# Patient Record
Sex: Female | Born: 1995 | Race: White | Hispanic: No | Marital: Married | Smoking: Former smoker
Health system: Southern US, Community
[De-identification: ages and names within clinical notes are randomized; demographics above are authoritative.]

## PROBLEM LIST (undated history)

## (undated) ENCOUNTER — Emergency Department (HOSPITAL_BASED_OUTPATIENT_CLINIC_OR_DEPARTMENT_OTHER): Admission: EM | Payer: Self-pay | Source: Home / Self Care

## (undated) DIAGNOSIS — F329 Major depressive disorder, single episode, unspecified: Secondary | ICD-10-CM

## (undated) DIAGNOSIS — J45909 Unspecified asthma, uncomplicated: Secondary | ICD-10-CM

## (undated) DIAGNOSIS — F419 Anxiety disorder, unspecified: Secondary | ICD-10-CM

## (undated) DIAGNOSIS — F32A Depression, unspecified: Secondary | ICD-10-CM

## (undated) HISTORY — DX: Unspecified asthma, uncomplicated: J45.909

## (undated) HISTORY — DX: Depression, unspecified: F32.A

## (undated) HISTORY — DX: Major depressive disorder, single episode, unspecified: F32.9

---
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
## 2014-09-26 HISTORY — PX: KNEE ARTHROSCOPY: SHX127

## 2016-12-07 ENCOUNTER — Emergency Department (HOSPITAL_COMMUNITY)
Admission: EM | Admit: 2016-12-07 | Discharge: 2016-12-07 | Disposition: A | Discharge status: Home / Self Care | Payer: Federal, State, Local not specified - PPO | Attending: Emergency Medicine | Admitting: Emergency Medicine

## 2016-12-07 ENCOUNTER — Encounter (HOSPITAL_COMMUNITY): Payer: Self-pay | Admitting: Emergency Medicine

## 2016-12-07 DIAGNOSIS — T7840XA Allergy, unspecified, initial encounter: Secondary | ICD-10-CM | POA: Diagnosis present

## 2016-12-07 HISTORY — DX: Anxiety disorder, unspecified: F41.9

## 2016-12-07 MED ORDER — DIPHENHYDRAMINE HCL 25 MG PO CAPS
50.0000 mg | ORAL_CAPSULE | Freq: Once | ORAL | Status: AC
  Administered 2016-12-07: 50 mg via ORAL
  Filled 2016-12-07: qty 2

## 2016-12-07 MED ORDER — DIPHENHYDRAMINE HCL 25 MG PO CAPS: 25.0000 mg | ORAL_CAPSULE | Freq: Four times a day (QID) | ORAL | 0 refills | Status: DC | PRN

## 2016-12-07 NOTE — ED Provider Notes (Signed)
[facility]-EMERGENCY DEPT Provider Note    By signing my name below, I, Earmon PhoenixJennifer Waddell, attest that this documentation has been prepared under the direction and in the presence of Melburn HakeNicole Izaya Netherton, PA-C. Electronically Signed: Earmon PhoenixJennifer Waddell, ED Scribe. 12/07/16. 8:51 PM.    History   Chief Complaint Chief Complaint  Patient presents with  . Allergic Reaction   The history is provided by the patient and medical records. No language interpreter was used.    [address]Amanda Espinoza is an obese 21 y.o. female who presents to the Emergency Department complaining of improving hives that began around 5:30pm, about 10-15 minutes after eating dinner. She reports associated improving tongue swelling and throat itching after taking an antihistamine at home. Pt reports eating chicken and frozen mixed vegetables she made at home. She has taken a generic antihistamine with some relief. There are no modifying factors noted. She denies difficulty breathing or swallowing, CP, abdominal pain, nausea, vomiting, fever, chills. She denies any new medications. 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   Past Medical History:  Diagnosis Date  . Anxiety     There are no active problems to display for this patient.   Past Surgical History:  Procedure Laterality Date  . APPENDECTOMY      OB History    No data available       Home Medications    Prior to Admission medications   Medication Sig Start Date End Date Taking? Authorizing Provider  diphenhydrAMINE (BENADRYL) 25 mg capsule Take 1 capsule (25 mg total) by mouth every 6 (six) hours as needed. 12/07/16   Barrett HenleNicole Elizabeth Biance Moncrief, PA-C    Family History No family history on file.  Social History Social History  Substance Use Topics  . Smoking status: Never Smoker  . Smokeless tobacco: Never Used  . Alcohol use Yes     Allergies   Patient has no allergy information on record.   Review of Systems Review of Systems  Constitutional: Negative for  chills and fever.  HENT: Negative for trouble swallowing.        Itchy tongue and throat  Respiratory: Negative for shortness of breath.   Cardiovascular: Negative for chest pain.  Gastrointestinal: Negative for nausea and vomiting.  Skin: Positive for rash.     Physical Exam Updated Vital Signs BP 114/89 (BP Location: Right Arm)   Pulse 84   Temp 98.7 F (37.1 C) (Oral)   Resp 22   LMP 11/23/2016   SpO2 99%   Physical Exam  Constitutional: She is oriented to person, place, and time. She appears well-developed and well-nourished.  HENT:  Head: Normocephalic and atraumatic.  Mouth/Throat: Uvula is midline, oropharynx is clear and moist and mucous membranes are normal. No oropharyngeal exudate, posterior oropharyngeal edema, posterior oropharyngeal erythema or tonsillar abscesses. No tonsillar exudate.  No facial or neck swelling. No tongue swelling. Floor of mouth soft. Pt tolerating secretions.  Eyes: Conjunctivae and EOM are normal. Right eye exhibits no discharge. Left eye exhibits no discharge. No scleral icterus.  Neck: Normal range of motion. Neck supple.  Cardiovascular: Normal rate, regular rhythm, normal heart sounds and intact distal pulses.  Exam reveals no gallop and no friction rub.   No murmur heard. Pulmonary/Chest: Effort normal and breath sounds normal. No respiratory distress. She has no wheezes. She has no rales. She exhibits no tenderness.  Abdominal: Soft. Bowel sounds are normal. She exhibits no distension and no mass. There is no tenderness. There is no rebound and no guarding.  Musculoskeletal: Normal range of motion. She exhibits no edema.  Lymphadenopathy:    She has no cervical adenopathy.  Neurological: She is alert and oriented to person, place, and time.  Skin: Skin is warm and dry.  Few small scattered erythematous macules present to anterior neck.  Nursing note and vitals reviewed.    ED Treatments / Results  DIAGNOSTIC STUDIES: Oxygen  Saturation is 99% on RA, normal by my interpretation.   COORDINATION OF CARE: 8:49 PM- Will order dose of Benadryl prior to discharge. Pt verbalizes understanding and agrees to plan.  Medications  diphenhydrAMINE (BENADRYL) capsule 50 mg (50 mg Oral Given 12/07/16 2120)    Labs (all labs ordered are listed, but only abnormal results are displayed) Labs Reviewed - No data to display  EKG  EKG Interpretation None       Radiology No results found.  Procedures Procedures (including critical care time)  Medications Ordered in ED Medications  diphenhydrAMINE (BENADRYL) capsule 50 mg (50 mg Oral Given 12/07/16 2120)     Initial Impression / Assessment and Plan / ED Course  I have reviewed the triage vital signs and the nursing notes.  Pertinent labs & imaging results that were available during my care of the patient were reviewed by me and considered in my medical decision making (see chart for details).     Patient presents with allergic reaction. Symptoms improved after taking an antihistamine at home prior to arrival. Patient re-evaluated prior to dc, is hemodynamically stable, in no respiratory distress, and denies the feeling of throat closing. Pt has been advised to take OTC benadryl & return to the ED if they have a mod-severe allergic rxn (s/s including throat closing, difficulty breathing, swelling of lips face or tongue). Pt is to follow up with their PCP. Pt is agreeable with plan & verbalizes understanding.   I personally performed the services described in this documentation, which was scribed in my presence. The recorded information has been reviewed and is accurate.    Final Clinical Impressions(s) / ED Diagnoses   Final diagnoses:  Allergic reaction, initial encounter    New Prescriptions New Prescriptions   DIPHENHYDRAMINE (BENADRYL) 25 MG CAPSULE    Take 1 capsule (25 mg total) by mouth every 6 (six) hours as needed.     Satira Sark [place],  [place] 12/07/16 2125    Canary Brim Tegeler, MD 12/08/16 2101

## 2016-12-07 NOTE — Discharge Instructions (Signed)
I recommend continuing to take Benadryl as prescribed as needed for hives/itching/swelling. I recommend refraining from eating any of the foods he had tonight which may have caused her allergic reaction. Follow-up with your primary care provider regarding your allergic reaction. Return to the emergency department if symptoms worsen or new onset of fever, facial/neck swelling, swelling of lips/tongue/throat, drooling due to being unable to swallow, difficulty breathing, sensation of throat closing, vomiting.

## 2016-12-07 NOTE — ED Notes (Signed)
Pt states that she made dinner and started to have hives as well as itchiness to her tongue, and throat. She states they were on her face, neck, and legs. She reports taking a generic antihistamine similar to claritin prior to arrival.

## 2016-12-07 NOTE — ED Notes (Signed)
This RN spoke with Mardella LaymanNicole PA-C on behalf of triage to get patient seen in pod C. PA is okay with seeing patient as long as no angioedema is present.

## 2016-12-07 NOTE — ED Triage Notes (Signed)
Pt c/o hives, itchy throat, and swollen tongue since eating chicken and mixed vegetables at 5:30pm.  Pt has known allergy to tree nuts and states it may have been in a new spice she used.  Speaking in complete sentences.  NAD at this time.

## 2017-04-26 ENCOUNTER — Emergency Department (HOSPITAL_BASED_OUTPATIENT_CLINIC_OR_DEPARTMENT_OTHER)
Admission: EM | Admit: 2017-04-26 | Discharge: 2017-04-26 | Disposition: A | Discharge status: Home / Self Care | Payer: Worker's Compensation | Attending: Physician Assistant | Admitting: Physician Assistant

## 2017-04-26 ENCOUNTER — Emergency Department (HOSPITAL_BASED_OUTPATIENT_CLINIC_OR_DEPARTMENT_OTHER): Payer: Worker's Compensation

## 2017-04-26 ENCOUNTER — Encounter (HOSPITAL_BASED_OUTPATIENT_CLINIC_OR_DEPARTMENT_OTHER): Payer: Self-pay | Admitting: *Deleted

## 2017-04-26 DIAGNOSIS — M79672 Pain in left foot: Secondary | ICD-10-CM | POA: Diagnosis not present

## 2017-04-26 DIAGNOSIS — Y9259 Other trade areas as the place of occurrence of the external cause: Secondary | ICD-10-CM | POA: Diagnosis not present

## 2017-04-26 DIAGNOSIS — Y9389 Activity, other specified: Secondary | ICD-10-CM | POA: Diagnosis not present

## 2017-04-26 DIAGNOSIS — S99922A Unspecified injury of left foot, initial encounter: Secondary | ICD-10-CM | POA: Diagnosis present

## 2017-04-26 DIAGNOSIS — Y990 Civilian activity done for income or pay: Secondary | ICD-10-CM | POA: Insufficient documentation

## 2017-04-26 DIAGNOSIS — W228XXA Striking against or struck by other objects, initial encounter: Secondary | ICD-10-CM | POA: Insufficient documentation

## 2017-04-26 MED ORDER — IBUPROFEN 800 MG PO TABS
800.0000 mg | ORAL_TABLET | Freq: Once | ORAL | Status: AC
  Administered 2017-04-26: 800 mg via ORAL
  Filled 2017-04-26: qty 1

## 2017-04-26 MED ORDER — NAPROXEN 500 MG PO TABS: 500.0000 mg | ORAL_TABLET | Freq: Two times a day (BID) | ORAL | 0 refills | Status: DC

## 2017-04-26 NOTE — Discharge Instructions (Signed)
X-rays did not show any fracture/broken bone. I've attached information on Rice therapy which I would like you to follow at home over the next few days. I am prescribing you an anti-inflammatory medication: Naproxen which can be taken as needed for pain. Please take this medication with food as it can upset her stomach. If he develop any fever, red streaking up the leg, or new concerning/worsening symptoms he can return to the emergency department for further evaluation. Please follow-up with your primary care physician for persistent symptoms. If you have a primary care physician you can use the number attached to this handout to call and find one.

## 2017-04-26 NOTE — ED Triage Notes (Signed)
Pt c/o left foot injury x 3 hrs ago while at work

## 2017-04-26 NOTE — ED Provider Notes (Signed)
[facility]   CSN: [id number] Arrival date & time: 04/26/17  1741     History   Chief Complaint Chief Complaint  Patient presents with  . Foot Injury    HPI Amanda Espinoza is a 21 y.o. female into the emergency department today for left foot injury that occurred at work approximately 3 hours ago. The patient states that she was trying to move a bookshelf when it fell onto her left foot. The patient is now having pain just proximal to her great toe on the left foot. She states this is achy and rates the pain as 5/10. She states that she "wants to make sure is not broken". The patient was able to ambulate after the event but states that it is painful and she had to place most of her weight on the heel of her foot. Patient denies numbness or tingling distal to the injury. No decreased range of motion. No open wound or surrounding erythema  HPI  Past Medical History:  Diagnosis Date  . Anxiety     There are no active problems to display for this patient.   Past Surgical History:  Procedure Laterality Date  . APPENDECTOMY    . KNEE ARTHROSCOPY      OB History    No data available       Home Medications    Prior to Admission medications   Medication Sig Start Date End Date Taking? Authorizing Provider  citalopram (CELEXA) 20 MG tablet Take 20 mg by mouth daily.   Yes [provider]  diphenhydrAMINE (BENADRYL) 25 mg capsule Take 1 capsule (25 mg total) by mouth every 6 (six) hours as needed. 12/07/16   Barrett HenleNadeau, Nicole Elizabeth, PA-C  naproxen (NAPROSYN) 500 MG tablet Take 1 tablet (500 mg total) by mouth 2 (two) times daily. 04/26/17   Tyjah Hai, Elmer SowMichael M, PA-C    Family History History reviewed. No pertinent family history.  Social History Social History  Substance Use Topics  . Smoking status: Never Smoker  . Smokeless tobacco: Never Used  . Alcohol use Yes     Allergies   Patient has no known allergies.   Review of  Systems Review of Systems  Musculoskeletal: Positive for arthralgias.  Skin: Negative for wound.  Neurological: Negative for numbness.     Physical Exam Updated Vital Signs BP 130/80   Pulse 72   Temp 97.8 F (36.6 C)   Resp 18   Ht 5\' 5"  (1.651 m)   Wt 95.3 kg (210 lb)   LMP 04/05/2017   SpO2 99%   BMI 34.95 kg/m   Physical Exam  Constitutional: She appears well-developed and well-nourished.  No acute distress  HENT:  Head: Normocephalic and atraumatic.  Right Ear: External ear normal.  Left Ear: External ear normal.  Eyes: Conjunctivae are normal. Right eye exhibits no discharge. Left eye exhibits no discharge. No scleral icterus.  Cardiovascular:  Pulses:      Dorsalis pedis pulses are 2+ on the right side, and 2+ on the left side.       Posterior tibial pulses are 2+ on the right side, and 2+ on the left side.  Pulmonary/Chest: Effort normal. No respiratory distress.  Musculoskeletal:       Left ankle: Normal.       Left foot: There is tenderness. There is normal range of motion, no swelling, normal capillary refill, no deformity and no laceration.       Feet:  Neurovascular intact  distally.  Neurological: She is alert.  Skin: Skin is warm, dry and intact. Capillary refill takes less than 2 seconds. No bruising, no ecchymosis and no laceration noted. She is not diaphoretic. No erythema. No pallor.  Psychiatric: She has a normal mood and affect.  Nursing note and vitals reviewed.    ED Treatments / Results  Labs (all labs ordered are listed, but only abnormal results are displayed) Labs Reviewed - No data to display  EKG  EKG Interpretation None       Radiology Dg Foot Complete Left  Result Date: 04/26/2017 CLINICAL DATA:  21 year old female with a history left foot pain after a fall EXAM: LEFT FOOT - COMPLETE 3+ VIEW COMPARISON:  None. FINDINGS: No acute displaced fracture no joint effusion. No focal soft tissue swelling. No radiopaque foreign body.  IMPRESSION: Negative for acute bony abnormality. Electronically Signed   By: Gilmer MorJaime  Wagner D.O.   On: 04/26/2017 18:22    Procedures Procedures (including critical care time)  Medications Ordered in ED Medications  ibuprofen (ADVIL,MOTRIN) tablet 800 mg (800 mg Oral Given 04/26/17 1803)     Initial Impression / Assessment and Plan / ED Course  I have reviewed the triage vital signs and the nursing notes.  Pertinent labs & imaging results that were available during my care of the patient were reviewed by me and considered in my medical decision making (see chart for details).     21 year old with left foot pain after dropping bookshelf on foot. Vital signs reassuring on presentation. Rom able, skin intact, and patient is neurovascularly intact distally to the injury. X-rays negative for fracture. Patient pain controlled in the emergency department. Will treat the patient with anti-inflammatory medication, Rice therapy and conservative treatment. Advised patient to follow with PCP for persistent symptoms. I advised the patient to return to the emergency department with new or worsening symptoms or new concerns. Specific return precautions discussed. The patient verbalized understanding and agreement with plan. All questions answered. No further questions at this time. The patient is hemodynamically stable, mentating appropriately and appears safe for discharge.  Final Clinical Impressions(s) / ED Diagnoses   Final diagnoses:  Left foot pain    New Prescriptions New Prescriptions   NAPROXEN (NAPROSYN) 500 MG TABLET    Take 1 tablet (500 mg total) by mouth 2 (two) times daily.     Princella PellegriniMaczis, Aakash Hollomon M, PA-C 04/26/17 1839    Abelino DerrickMackuen, Courteney Lyn, MD 04/27/17 2351

## 2017-10-24 ENCOUNTER — Other Ambulatory Visit: Payer: Self-pay

## 2017-10-24 ENCOUNTER — Encounter (HOSPITAL_COMMUNITY): Payer: Self-pay | Admitting: *Deleted

## 2017-10-24 ENCOUNTER — Emergency Department (HOSPITAL_COMMUNITY)
Admission: EM | Admit: 2017-10-24 | Discharge: 2017-10-24 | Disposition: A | Discharge status: Home / Self Care | Payer: Federal, State, Local not specified - PPO | Attending: Emergency Medicine | Admitting: Emergency Medicine

## 2017-10-24 DIAGNOSIS — Z79899 Other long term (current) drug therapy: Secondary | ICD-10-CM | POA: Insufficient documentation

## 2017-10-24 DIAGNOSIS — Z23 Encounter for immunization: Secondary | ICD-10-CM

## 2017-10-24 DIAGNOSIS — Z2914 Encounter for prophylactic rabies immune globin: Secondary | ICD-10-CM | POA: Insufficient documentation

## 2017-10-24 DIAGNOSIS — W5581XA Bitten by other mammals, initial encounter: Secondary | ICD-10-CM | POA: Insufficient documentation

## 2017-10-24 MED ORDER — RABIES VACCINE, PCEC IM SUSR
1.0000 mL | Freq: Once | INTRAMUSCULAR | Status: AC
  Administered 2017-10-24: 1 mL via INTRAMUSCULAR
  Filled 2017-10-24: qty 1

## 2017-10-24 MED ORDER — RABIES IMMUNE GLOBULIN 150 UNIT/ML IM INJ
20.0000 [IU]/kg | INJECTION | Freq: Once | INTRAMUSCULAR | Status: AC
  Administered 2017-10-24: 2250 [IU] via INTRAMUSCULAR
  Filled 2017-10-24: qty 15

## 2017-10-24 NOTE — ED Notes (Signed)
Provided patient with graham crackers and coke d/t feeling a little lightheaded after receiving her IM injections. States she felt better about 15 minutes later. VSS. Patient verbalized understanding of discharge instructions and f/u information with dates to go back for vaccine. She denies any further needs or questions at this time. Patient ambulatory with steady gait.

## 2017-10-24 NOTE — ED Provider Notes (Signed)
[facility]   CSN: [id number] Arrival date & time: 10/24/17  1347     History   Chief Complaint Chief Complaint  Patient presents with  . Animal Bite    HPI Alfonso RamusMadison Coombs is a 22 y.o. female who presents to ED for evaluation of possible bat bite to finger.  She works at an [employer] where she found a live bat in her classroom yesterday.  She picked up the bat with latex gloves and felt like there is a pinch on her right index finger.  There is no broken skin or break in the gloves.  However, she is concerned about possible rabies exposure.  States that her last tetanus shot was within the past year.  Denies any other symptoms at this time.  HPI  Past Medical History:  Diagnosis Date  . Anxiety     There are no active problems to display for this patient.   Past Surgical History:  Procedure Laterality Date  . APPENDECTOMY    . KNEE ARTHROSCOPY      OB History    No data available       Home Medications    Prior to Admission medications   Medication Sig Start Date End Date Taking? Authorizing Provider  citalopram (CELEXA) 20 MG tablet Take 20 mg by mouth daily.    [provider]  diphenhydrAMINE (BENADRYL) 25 mg capsule Take 1 capsule (25 mg total) by mouth every 6 (six) hours as needed. 12/07/16   Barrett HenleNadeau, Nicole Elizabeth, PA-C  naproxen (NAPROSYN) 500 MG tablet Take 1 tablet (500 mg total) by mouth 2 (two) times daily. 04/26/17   Maczis, Elmer SowMichael M, PA-C    Family History No family history on file.  Social History Social History   Tobacco Use  . Smoking status: Never Smoker  . Smokeless tobacco: Never Used  Substance Use Topics  . Alcohol use: Yes  . Drug use: No     Allergies   Patient has no known allergies.   Review of Systems Review of Systems  Constitutional: Negative for appetite change, chills and fever.  Eyes: Negative for visual disturbance.  Respiratory: Negative for  shortness of breath.   Cardiovascular: Negative for chest pain.  Gastrointestinal: Negative for nausea and vomiting.  Musculoskeletal: Negative for arthralgias, gait problem, myalgias, neck pain and neck stiffness.  Skin: Negative for color change and wound.  Neurological: Negative for tremors, seizures, speech difficulty and headaches.     Physical Exam Updated Vital Signs BP 127/73 (BP Location: Right Arm)   Pulse 64   Temp 98.9 F (37.2 C) (Oral)   Resp 16   Ht 5\' 5"  (1.651 m)   Wt 113.4 kg (250 lb)   SpO2 99%   BMI 41.60 kg/m   Physical Exam  Constitutional: She appears well-developed and well-nourished. No distress.  Nontoxic appearing and in no acute distress.  HENT:  Head: Normocephalic and atraumatic.  Eyes: Conjunctivae and EOM are normal. No scleral icterus.  Neck: Normal range of motion.  Cardiovascular: Normal rate, regular rhythm and normal heart sounds.  Pulmonary/Chest: Effort normal and breath sounds normal. No respiratory distress.  Neurological: She is alert.  Skin: No rash noted. She is not diaphoretic. No erythema.  No break in skin of R index finger. No changes in ROM of digits, wrist. No color or temperature change of joints.  Psychiatric: She has a normal mood and affect.  Nursing note and vitals reviewed.  ED Treatments / Results  Labs (all labs ordered are listed, but only abnormal results are displayed) Labs Reviewed - No data to display  EKG  EKG Interpretation None       Radiology No results found.  Procedures Procedures (including critical care time)  Medications Ordered in ED Medications  rabies vaccine (RABAVERT) injection 1 mL (1 mL Intramuscular Given 10/24/17 1725)  rabies immune globulin (HYPERAB/KEDRAB) injection 2,250 Units (2,250 Units Intramuscular Given 10/24/17 1747)     Initial Impression / Assessment and Plan / ED Course  I have reviewed the triage vital signs and the nursing notes.  Pertinent labs & imaging  results that were available during my care of the patient were reviewed by me and considered in my medical decision making (see chart for details).     Patient presents to ED for possible rabies exposure. 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 The bat was alive and she felt a pinching sensation on her R index finger. Denies break in skin or gloves. Denies any pain at site or other symptoms. No wound or erythema noted in physical exam. She appears overall well. Afebrile. She is concerned about rabies exposure. Due to direct contact with bat, she is at risk for exposure. Will give rabies vaccine and immune globulin today, and advise her to return in appropriate time to [facility] center for subsequent doses.  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  There were no students present at that time.  However she is not sure how long the bat had been on the premises.  I did discuss with her that she should make the other staff and possibly the parents of students aware of the situation due to the fatality of rabies exposure.  She states that she has spoken to the principal of the school regarding this. Patient appears stable for discharge at this time. Strict return precautions given.  Portions of this note were generated with Scientist, clinical (histocompatibility and immunogenetics). Dictation errors may occur despite best attempts at proofreading.  Final Clinical Impressions(s) / ED Diagnoses   Final diagnoses:  Need for rabies vaccination    ED Discharge Orders    None        Dietrich Pates, PA-C 10/24/17 1749    Linwood Dibbles, MD 10/25/17 0010

## 2017-10-24 NOTE — ED Triage Notes (Signed)
Pt picked up a bat on the floor and it bit the glove.  Patient states there was not break in the glove or noted bite on right index finger.

## 2017-10-24 NOTE — Discharge Instructions (Addendum)
Please read attached letter regarding [facility]. Follow up with your PCP for any worker's compensation paperwork, as needed.

## 2018-07-11 ENCOUNTER — Encounter: Payer: Self-pay | Admitting: Internal Medicine

## 2018-07-11 ENCOUNTER — Ambulatory Visit: Payer: Medicaid Other | Admitting: Internal Medicine

## 2018-07-11 VITALS — BP 112/78 | HR 82 | Resp 12 | Ht 65.0 in | Wt 253.0 lb

## 2018-07-11 DIAGNOSIS — F32A Depression, unspecified: Secondary | ICD-10-CM | POA: Insufficient documentation

## 2018-07-11 DIAGNOSIS — F329 Major depressive disorder, single episode, unspecified: Secondary | ICD-10-CM | POA: Diagnosis not present

## 2018-07-11 DIAGNOSIS — F4310 Post-traumatic stress disorder, unspecified: Secondary | ICD-10-CM | POA: Diagnosis not present

## 2018-07-11 DIAGNOSIS — F419 Anxiety disorder, unspecified: Secondary | ICD-10-CM | POA: Insufficient documentation

## 2018-07-11 DIAGNOSIS — Z6841 Body Mass Index (BMI) 40.0 and over, adult: Secondary | ICD-10-CM

## 2018-07-11 MED ORDER — CITALOPRAM HYDROBROMIDE 40 MG PO TABS: 40.0000 mg | ORAL_TABLET | Freq: Every day | ORAL | 11 refills | Status: DC
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
## 2018-07-12 LAB — CBC WITH DIFFERENTIAL/PLATELET
Basophils Absolute: 0.1 10*3/uL (ref 0.0–0.2)
Basos: 1 %
EOS (ABSOLUTE): 0.2 10*3/uL (ref 0.0–0.4)
Eos: 3 %
Hematocrit: 41.6 % (ref 34.0–46.6)
Hemoglobin: 14.2 g/dL (ref 11.1–15.9)
Immature Grans (Abs): 0 10*3/uL (ref 0.0–0.1)
Immature Granulocytes: 0 %
Lymphocytes Absolute: 2.1 10*3/uL (ref 0.7–3.1)
Lymphs: 38 %
MCH: 29.6 pg (ref 26.6–33.0)
MCHC: 34.1 g/dL (ref 31.5–35.7)
MCV: 87 fL (ref 79–97)
Monocytes Absolute: 0.5 10*3/uL (ref 0.1–0.9)
Monocytes: 8 %
Neutrophils Absolute: 2.8 10*3/uL (ref 1.4–7.0)
Neutrophils: 50 %
Platelets: 270 10*3/uL (ref 150–450)
RBC: 4.8 x10E6/uL (ref 3.77–5.28)
RDW: 12.5 % (ref 12.3–15.4)
WBC: 5.6 10*3/uL (ref 3.4–10.8)

## 2018-07-12 LAB — COMPREHENSIVE METABOLIC PANEL
ALT: 16 IU/L (ref 0–32)
AST: 18 IU/L (ref 0–40)
Albumin/Globulin Ratio: 2 (ref 1.2–2.2)
Albumin: 4.4 g/dL (ref 3.5–5.5)
Alkaline Phosphatase: 61 IU/L (ref 39–117)
BUN/Creatinine Ratio: 15 (ref 9–23)
BUN: 11 mg/dL (ref 6–20)
Bilirubin Total: 0.5 mg/dL (ref 0.0–1.2)
CO2: 23 mmol/L (ref 20–29)
Calcium: 9 mg/dL (ref 8.7–10.2)
Chloride: 104 mmol/L (ref 96–106)
Creatinine, Ser: 0.75 mg/dL (ref 0.57–1.00)
GFR calc Af Amer: 130 mL/min/{1.73_m2} (ref >59)
GFR calc non Af Amer: 113 mL/min/{1.73_m2} (ref >59)
Globulin, Total: 2.2 g/dL (ref 1.5–4.5)
Glucose: 81 mg/dL (ref 65–99)
Potassium: 4.6 mmol/L (ref 3.5–5.2)
Sodium: 142 mmol/L (ref 134–144)
Total Protein: 6.6 g/dL (ref 6.0–8.5)

## 2018-07-12 LAB — LIPID PANEL W/O CHOL/HDL RATIO
Cholesterol, Total: 173 mg/dL (ref 100–199)
HDL: 43 mg/dL (ref >39)
LDL Calculated: 105 mg/dL — ABNORMAL HIGH (ref 0–99)
Triglycerides: 125 mg/dL (ref 0–149)
VLDL Cholesterol Cal: 25 mg/dL (ref 5–40)

## 2018-07-12 LAB — TSH: TSH: 0.769 u[IU]/mL (ref 0.450–4.500)

## 2018-07-13 ENCOUNTER — Ambulatory Visit: Payer: Self-pay | Admitting: Licensed Clinical Social Worker

## 2018-07-13 DIAGNOSIS — F439 Reaction to severe stress, unspecified: Secondary | ICD-10-CM

## 2018-07-19 ENCOUNTER — Other Ambulatory Visit: Payer: Self-pay | Admitting: Licensed Clinical Social Worker

## 2018-07-19 NOTE — Progress Notes (Deleted)
   THERAPY PROGRESS NOTE  Session Time: 60 mins  Participation Level: Active  Behavioral Response: Casual{BHH LEVEL OF CONSCIOUSNESS:22305}{BHH MOOD:22306}  Type of Therapy: Individual Therapy  Treatment Goals addressed: Coping  Interventions: {CHL AMB BH Type of Intervention:21022753}  Summary: Amanda Espinoza is a 22 y.o. female who presents with ***                  .   Suicidal/Homicidal: No{yes/no/with/without intent/plan:22693}  Therapist Response: ***  Plan: Return again in 1 week.  Diagnosis: Axis I: {psych axis 1:31909}    Axis II: {psych axis 2:31910}    Ja Pistole Roberts-Collie, Student-Social Work 07/19/2018
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
## 2018-07-19 NOTE — Progress Notes (Signed)
Social Work Theatre manager, Astronomer met with [employer] after a warm hand off from Dr. Amil Amen. She stated that she was feeling anxious and stress. SWI scheduled counseling session with Katerin to help her gain coping skills that will alleviate her stress and anxiety.

## 2018-08-03 ENCOUNTER — Telehealth: Payer: Self-pay | Admitting: Internal Medicine

## 2018-08-03 NOTE — Telephone Encounter (Signed)
Social Work Intern Masayuki Sakai [employer] twice and left messages concerning counseling. She has not returned any calls.

## 2018-09-07 ENCOUNTER — Ambulatory Visit: Payer: Self-pay | Admitting: Internal Medicine

## 2018-09-19 IMAGING — CR DG FOOT COMPLETE 3+V*L*
3 series · 3 of 3 positions shown · non-contrast
Comparison: None.

CLINICAL DATA: 20-year-old female with a history left foot pain
after a fall

EXAM:
LEFT FOOT - COMPLETE 3+ VIEW

[t foot ap left]
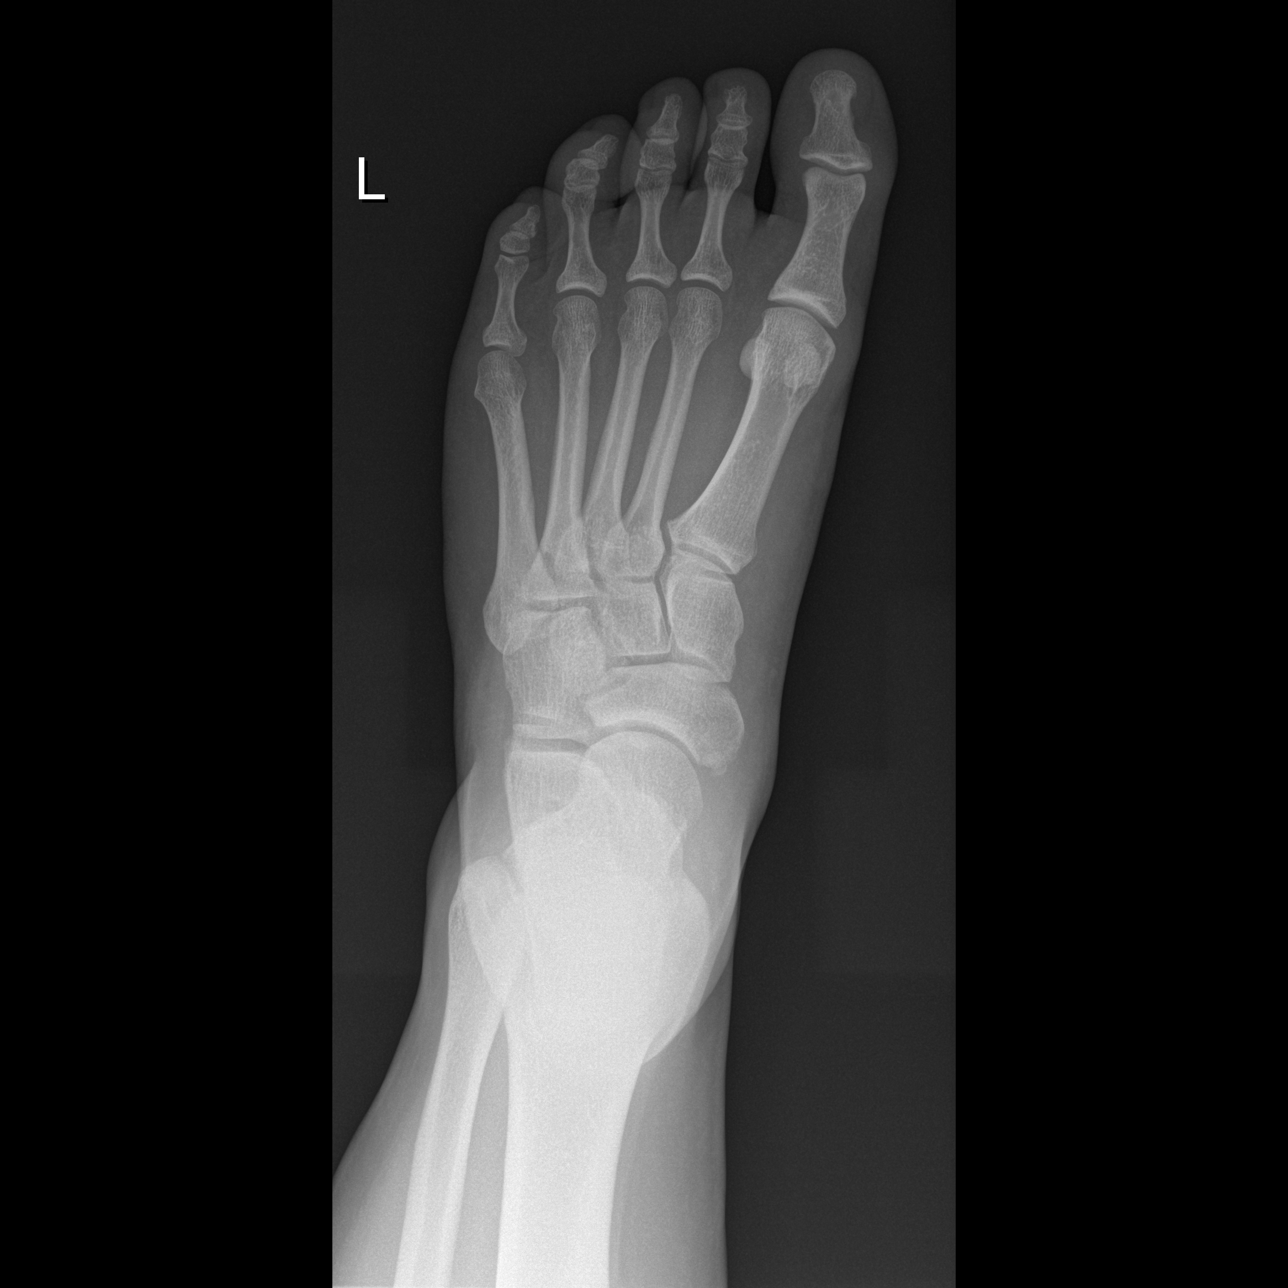

[t foot oblique left]
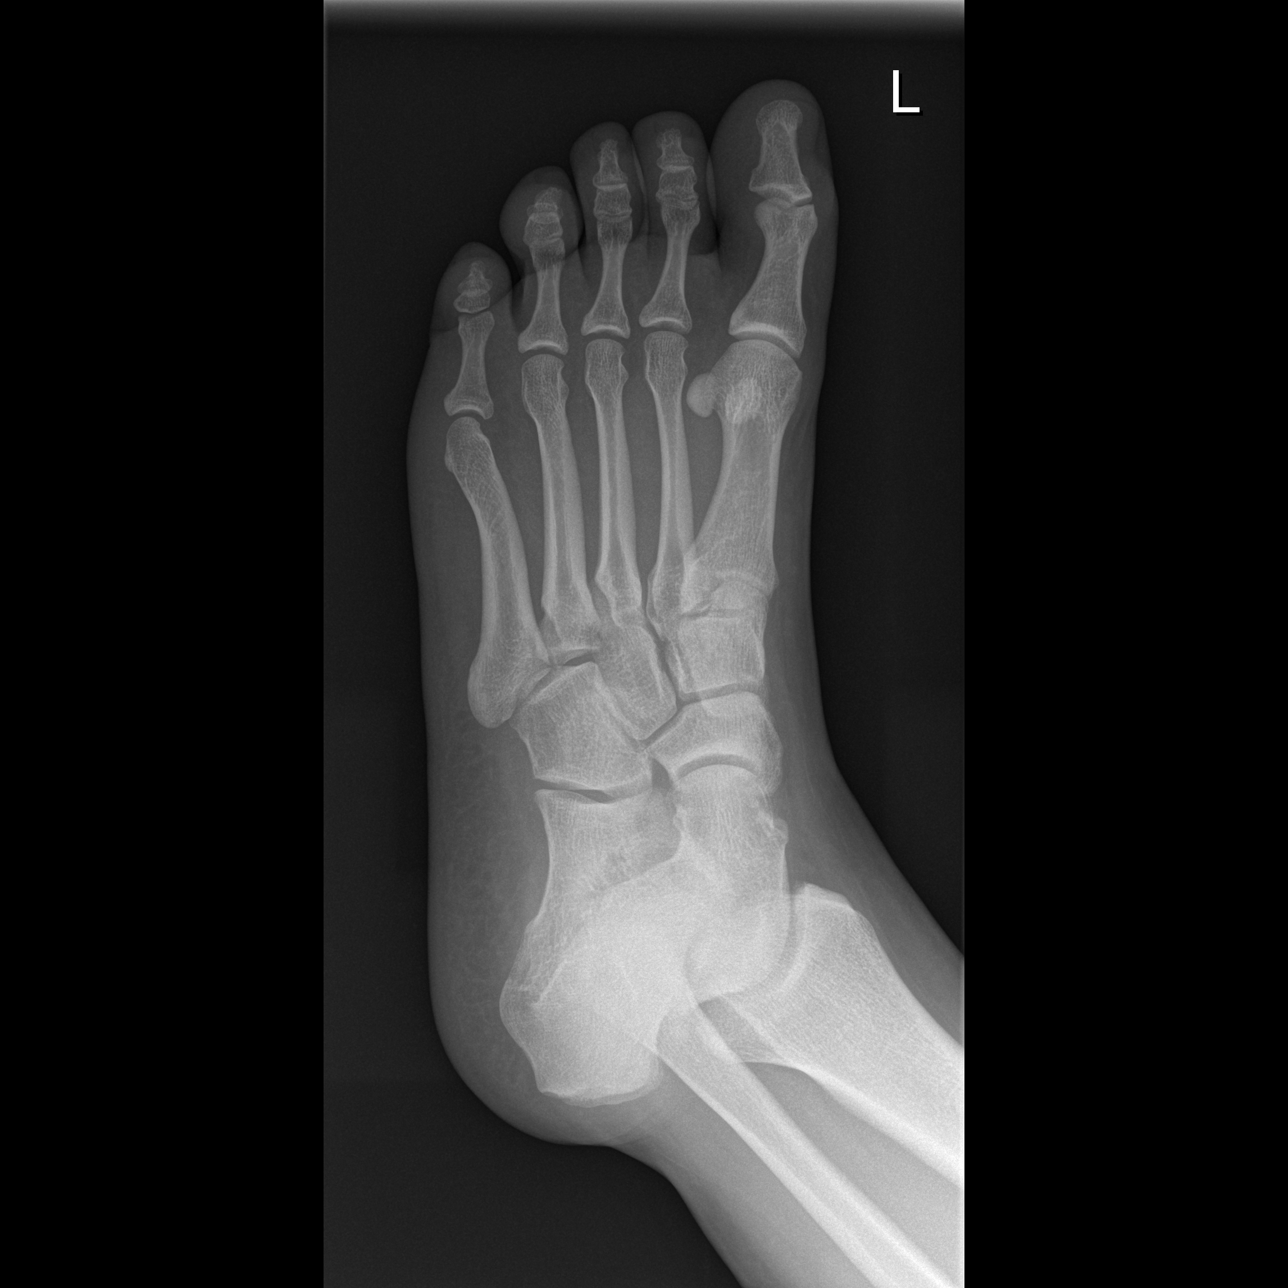

[t foot lat left]
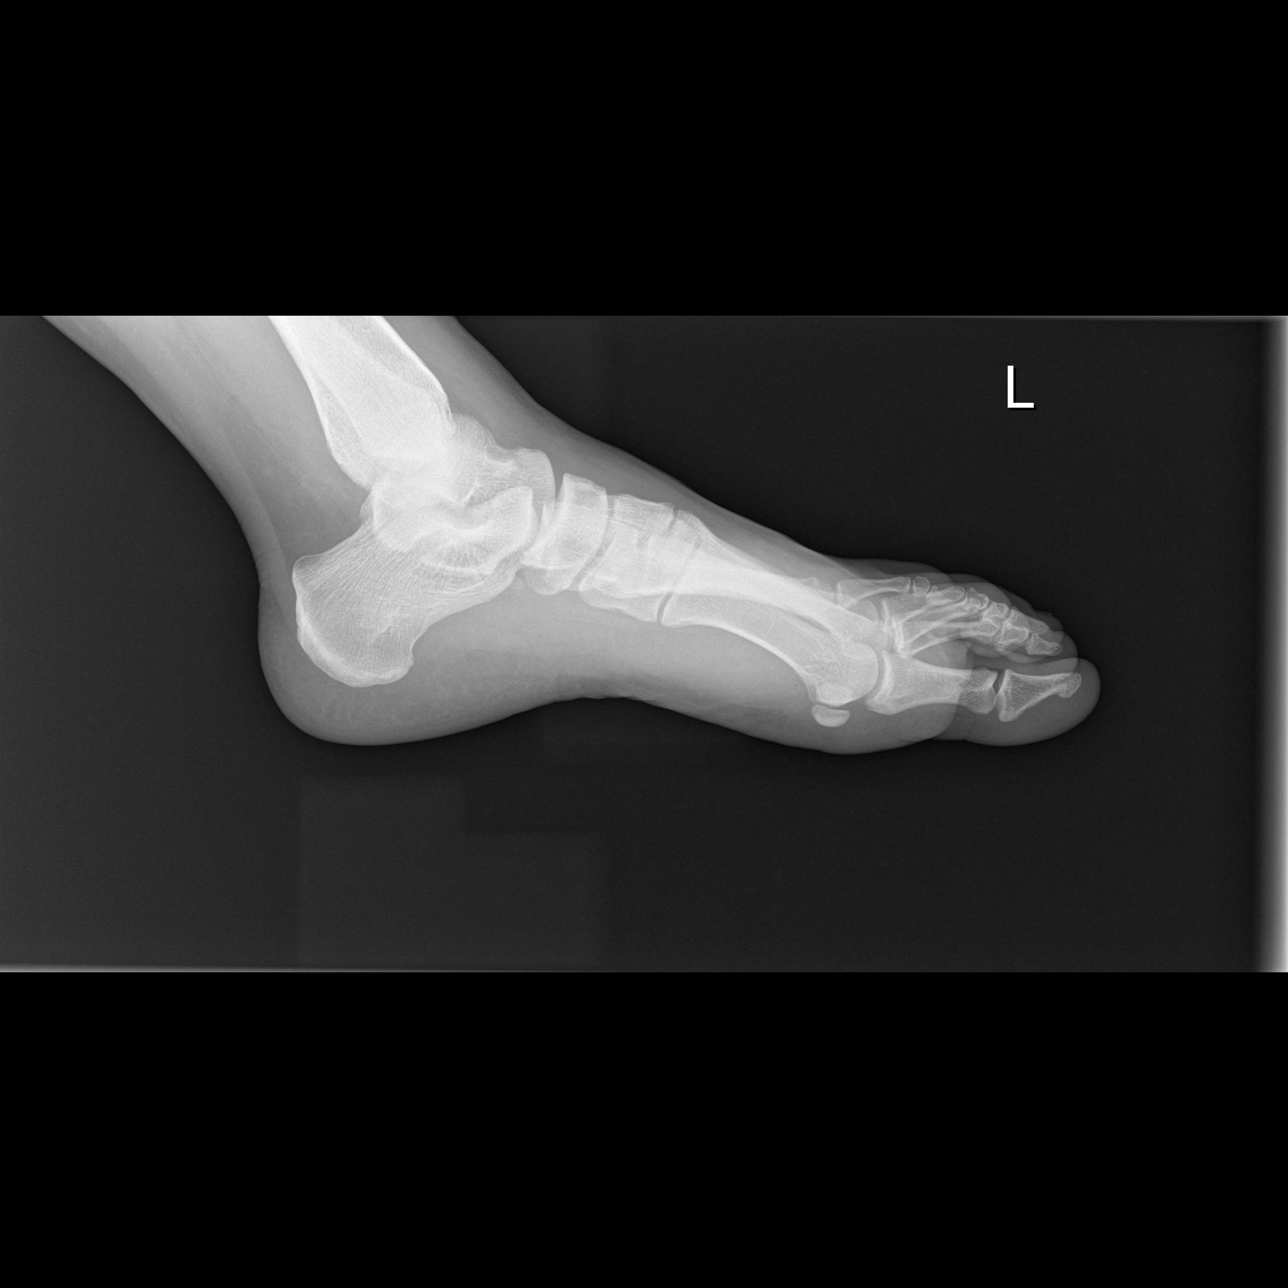

[3 of 3 positions shown; findings below may reference images not displayed]

FINDINGS: No acute displaced fracture no joint effusion. No focal soft tissue
swelling. No radiopaque foreign body.
IMPRESSION: Negative for acute bony abnormality.

## 2018-09-22 DIAGNOSIS — Z6841 Body Mass Index (BMI) 40.0 and over, adult: Secondary | ICD-10-CM

## 2019-07-03 ENCOUNTER — Telehealth: Payer: Self-pay | Admitting: Family

## 2019-07-03 ENCOUNTER — Telehealth: Payer: Self-pay

## 2019-07-03 ENCOUNTER — Encounter (INDEPENDENT_AMBULATORY_CARE_PROVIDER_SITE_OTHER): Payer: Self-pay

## 2019-07-03 ENCOUNTER — Other Ambulatory Visit: Payer: Self-pay

## 2019-07-03 DIAGNOSIS — Z20822 Contact with and (suspected) exposure to covid-19: Secondary | ICD-10-CM

## 2019-07-03 MED ORDER — ALBUTEROL SULFATE HFA 108 (90 BASE) MCG/ACT IN AERS: 2.0000 | INHALATION_SPRAY | Freq: Four times a day (QID) | RESPIRATORY_TRACT | 0 refills | Status: AC | PRN

## 2019-07-03 NOTE — Progress Notes (Signed)
E-Visit for Corona Virus Screening   Your current symptoms could be consistent with the coronavirus.  Many health care providers can now test patients at their office but not all are.  [facility] has multiple testing sites. For information on our COVID testing locations and hours go to [email or web address]  Please quarantine yourself while awaiting your test results.  We are enrolling you in our MyChart Home Montioring for COVID19 . Daily you will receive a questionnaire within the MyChart website. Our COVID 19 response team willl be monitoriing your responses daily.  You can go to one of the  testing sites listed below, while they are opened (see hours). You do not need an order and will stay in your car during the test. You do need to self isolate until your results return and if positive 14 days from when your symptoms started and until you are 3 days symptom free.   Testing Locations (Monday - Friday, 8 a.m. - 3:30 p.m.) . [place]: [facility], [address] [place], [place]  . [place]: [address], [address] [place], [place] (entrance off [address] [address])  . [facility]: (Closed each Monday): Testing site relocated to the short stay covered drive at [facility]. (Use the [facility] entrance to [facility] next to [facility].)   COVID-19 is a respiratory illness with symptoms that are similar to the flu. Symptoms are typically mild to moderate, but there have been cases of severe illness and death due to the virus. The following symptoms may appear 2-14 days after exposure: . Fever . Cough . Shortness of breath or difficulty breathing . Chills . Repeated shaking with chills . Muscle pain . Headache . Sore throat . New loss of taste or smell . Fatigue . Congestion or runny nose . Nausea or vomiting . Diarrhea  It is vitally important that if you feel that you  have an infection such as this virus or any other virus that you stay home and away from places where you may spread it to others.  You should self-quarantine for 14 days if you have symptoms that could potentially be coronavirus or have been in close contact a with a person diagnosed with COVID-19 within the last 2 weeks. You should avoid contact with people age 56 and older.   You should wear a mask or cloth face covering over your nose and mouth if you must be around other people or animals, including pets (even at home). Try to stay at least 6 feet away from other people. This will protect the people around you.  You can use medication such as A prescription inhaler called Albuterol MDI 90 mcg /actuation 2 puffs every 4 hours as needed for shortness of breath, wheezing, cough.  You may also take acetaminophen (Tylenol) as needed for fever.   Reduce your risk of any infection by using the same precautions used for avoiding the common cold or flu:  Marland Kitchen Wash your hands often with soap and warm water for at least 20 seconds.  If soap and water are not readily available, use an alcohol-based hand sanitizer with at least 60% alcohol.  . If coughing or sneezing, cover your mouth and nose by coughing or sneezing into the elbow areas of your shirt or coat, into a tissue or into your sleeve (not your hands). . Avoid shaking hands with others and consider head nods or verbal greetings only. . Avoid  touching your eyes, nose, or mouth with unwashed hands.  . Avoid close contact with people who are sick. . Avoid places or events with large numbers of people in one location, like concerts or sporting events. . Carefully consider travel plans you have or are making. . If you are planning any travel outside or inside the [place], visit the CDC's Travelers' Health webpage for the latest health notices. . If you have some symptoms but not all symptoms, continue to monitor at home and seek medical attention if your  symptoms worsen. . If you are having a medical emergency, call 911.  HOME CARE . Only take medications as instructed by your medical team. . Drink plenty of fluids and get plenty of rest. . A steam or ultrasonic humidifier can help if you have congestion.   GET HELP RIGHT AWAY IF YOU HAVE EMERGENCY WARNING SIGNS** FOR COVID-19. If you or someone is showing any of these signs seek emergency medical care immediately. Call 911 or proceed to your closest emergency facility if: . You develop worsening high fever. . Trouble breathing . Bluish lips or face . Persistent pain or pressure in the chest . New confusion . Inability to wake or stay awake . You cough up blood. . Your symptoms become more severe  **This list is not all possible symptoms. Contact your medical provider for any symptoms that are sever or concerning to you.   MAKE SURE YOU   Understand these instructions.  Will watch your condition.  Will get help right away if you are not doing well or get worse.  Approximately 5 minutes was spent documenting and reviewing patient's chart.   Your e-visit answers were reviewed by a board certified advanced clinical practitioner to complete your personal care plan.  Depending on the condition, your plan could have included both over the counter or prescription medications.  If there is a problem please reply once you have received a response from your provider.  Your safety is important to [place].  If you have drug allergies check your prescription carefully.    You can use MyChart to ask questions about today's visit, request a non-urgent call back, or ask for a work or school excuse for 24 hours related to this e-Visit. If it has been greater than 24 hours you will need to follow up with your provider, or enter a new e-Visit to address those concerns. You will get an e-mail in the next two days asking about your experience.  I hope that your e-visit has been valuable and will speed your  recovery. Thank you for using e-visits.

## 2019-07-03 NOTE — Telephone Encounter (Signed)
Patient advised on SOB and weakness per protocol:  Shortness of breath is the same: continue to monitor at home   If symptoms become severe, i.e. shortness of breath at rest, gasping for air, wheezing, CALL 911 AND SEEK TREATMENT IN THE ED  IF PATIENT HAS WORSENING WEAKNESS WITH INABILITY TO STAND OR IF PATIENT HAS TO HOLD ON TO SOMETHING TO GET BALANCE, ADVISE PATIENT TO CALL 911 AND SEEK TREATMENT IN ED  Patient verbalized understanding and agrees with the plan.

## 2019-07-04 ENCOUNTER — Encounter (INDEPENDENT_AMBULATORY_CARE_PROVIDER_SITE_OTHER): Payer: Self-pay

## 2019-07-05 ENCOUNTER — Encounter (INDEPENDENT_AMBULATORY_CARE_PROVIDER_SITE_OTHER): Payer: Self-pay

## 2019-07-05 LAB — NOVEL CORONAVIRUS, NAA: SARS-CoV-2, NAA: NOT DETECTED

## 2019-09-12 ENCOUNTER — Ambulatory Visit: Payer: BC Managed Care – PPO | Attending: Internal Medicine

## 2019-09-12 DIAGNOSIS — Z20822 Contact with and (suspected) exposure to covid-19: Secondary | ICD-10-CM

## 2019-09-14 LAB — NOVEL CORONAVIRUS, NAA: SARS-CoV-2, NAA: NOT DETECTED

## 2020-12-01 ENCOUNTER — Ambulatory Visit (HOSPITAL_COMMUNITY)
Admission: EM | Admit: 2020-12-01 | Discharge: 2020-12-01 | Disposition: A | Discharge status: Home / Self Care | Payer: 59 | Attending: Psychiatry | Admitting: Psychiatry

## 2020-12-01 ENCOUNTER — Other Ambulatory Visit: Payer: Self-pay

## 2020-12-01 DIAGNOSIS — F411 Generalized anxiety disorder: Secondary | ICD-10-CM | POA: Diagnosis not present

## 2020-12-01 DIAGNOSIS — Z760 Encounter for issue of repeat prescription: Secondary | ICD-10-CM | POA: Diagnosis not present

## 2020-12-01 DIAGNOSIS — F324 Major depressive disorder, single episode, in partial remission: Secondary | ICD-10-CM | POA: Diagnosis not present

## 2020-12-01 MED ORDER — CITALOPRAM HYDROBROMIDE 40 MG PO TABS: 40.0000 mg | ORAL_TABLET | Freq: Every day | ORAL | 0 refills | Status: AC

## 2020-12-01 NOTE — ED Notes (Signed)
Patient A&O x 4, ambulatory. Patient discharged in no acute distress. Patient denied SI/HI, A/VH upon discharge. Patient verbalized understanding of all discharge instructions explained by staff, to include follow up appointments, RX's and safety plan. Safety maintained.  

## 2020-12-01 NOTE — BH Assessment (Signed)
TTS Triage: Pt to [facility] unaccompanied and voluntarily with chief complaint of needing a medication refill. Pt reports that she is in the process of changing providers and neither one of the providers will prescribe meds. Pt reports going to other urgent care sites but they were unable to prescribe medications. Pt reports if we can not assist her today she is thinking about going to ED.   Pt is routine

## 2020-12-01 NOTE — Discharge Instructions (Signed)

## 2020-12-01 NOTE — ED Provider Notes (Signed)
Behavioral Health Urgent Care Medical Screening Exam  Patient Name: Amanda Espinoza MRN: [id number] Date of Evaluation: 12/01/20 Chief Complaint:   Diagnosis:  Final diagnoses:  GAD (generalized anxiety disorder)  Major depressive disorder in partial remission, unspecified whether recurrent (HCC)  Encounter for medication refill    History of Present illness: Amanda Espinoza is a 25 y.o. female with a history of depression and anxiety who presents to the [facility] voluntarily for medication refill. She states that she has been on celexa 40 mg since she was 25yo for anxiety and depression and that she recently changed insurance which led to a change in providers and therefore has been unable to get her medication refilled. She state that she has an appointment with Dr. Garth Bigness at [facility] on 12/17/2020. She states that she ran out of her celexa 3-4 days ago and has since experienced some "brain fog and dizziness". She describes her mood as "ok" and denies SI/HI/AVH. She states that she is looking for a therapist but requests outpatient resources which were provided to her prior to discharge.   Past Psychiatric History: Previous Medication Trials: celexa and wellbutrin. States that wellbutrin made her panic attacks worse Previous Psychiatric Hospitalizations: yes, x1 when she was 25 yo for "panic and stress" when she moved of the house for the first time Previous Suicide Attempts: no History of Violence: no Outpatient psychiatrist: no  Social History: Marital Status: married Children: 0 Source of Income: Corporate investment banker Education:  getting associates degree in Veterinary surgeon Ed: no Housing Status: with partner / significant other History of phys/sexual abuse: did not assess Easy access to gun: no  Substance Use (with emphasis over the last 12 months) Recreational Drugs: denied Use of Alcohol: occasional, social use Tobacco Use: did not assess Rehab History: no H/O  Complicated Withdrawal: no   Family Psychiatric History: Mother-BPD and bipolar   Psychiatric Specialty Exam  Presentation  General Appearance:Appropriate for Environment; Casual; Well Groomed  Eye Contact:Good  Speech:Clear and Coherent; Normal Rate  Speech Volume:Normal  Handedness:No data recorded  Mood and Affect  Mood:Euthymic  Affect:Appropriate; Congruent   Thought Process  Thought Processes:Coherent; Goal Directed; Linear  Descriptions of Associations:Intact  Orientation:Full (Time, Place and Person)  Thought Content:WDL  Hallucinations:None  Ideas of Reference:None  Suicidal Thoughts:No  Homicidal Thoughts:No   Sensorium  Memory:Immediate Good; Recent Good; Remote Good  Judgment:Good  Insight:Good   Executive Functions  Concentration:Good  Attention Span:Good  Recall:Good  Fund of Knowledge:Good  Language:Good   Psychomotor Activity  Psychomotor Activity:No data recorded  Assets  Assets:Communication Skills; Desire for Improvement; Financial Resources/Insurance; Housing; Intimacy; Physical Health; Social Support; Talents/Skills; Vocational/Educational   Sleep  Sleep:Fair  Number of hours: No data recorded  No data recorded  Physical Exam: Physical Exam Constitutional:      Appearance: Normal appearance.  HENT:     Head: Normocephalic and atraumatic.  Eyes:     Extraocular Movements: Extraocular movements intact.  Pulmonary:     Effort: Pulmonary effort is normal.  Neurological:     Mental Status: She is alert.    Review of Systems  Constitutional: Negative for chills and fever.  Eyes: Negative for discharge.  Respiratory: Negative for cough.   Cardiovascular: Negative for chest pain.  Neurological: Positive for dizziness.  Psychiatric/Behavioral: Negative for depression, substance abuse and suicidal ideas.   There were no vitals taken for this visit. There is no height or weight on file to calculate  BMI.  Musculoskeletal: Strength & Muscle Tone:  within normal limits Gait & Station: normal Patient leans: N/A   [facility] MSE Discharge Disposition for Follow up and Recommendations: Based on my evaluation the patient does not appear to have an emergency medical condition and can be discharged with resources and follow up care in outpatient services for Individual Therapy  Patient provided with 30 day Rx for 40 mg celexa; sent to pharmacy of choice.   Estella Husk, MD 12/01/2020, 12:03 PM

## 2020-12-02 ENCOUNTER — Telehealth (HOSPITAL_COMMUNITY): Payer: Self-pay | Admitting: Family Medicine

## 2020-12-02 NOTE — Telephone Encounter (Signed)
Care Management - Follow Up [facility] Discharges   Writer attempted to make contact with patient today and was unsuccessful.  Writer was able to leave a HIPPA compliant voice message and will await callback.   

## 2020-12-20 ENCOUNTER — Encounter (HOSPITAL_COMMUNITY): Payer: Self-pay | Admitting: Emergency Medicine

## 2020-12-20 ENCOUNTER — Other Ambulatory Visit: Payer: Self-pay

## 2020-12-20 ENCOUNTER — Emergency Department (HOSPITAL_COMMUNITY)
Admission: EM | Admit: 2020-12-20 | Discharge: 2020-12-20 | Disposition: A | Discharge status: Home / Self Care | Payer: 59 | Attending: Emergency Medicine | Admitting: Emergency Medicine

## 2020-12-20 DIAGNOSIS — R062 Wheezing: Secondary | ICD-10-CM | POA: Diagnosis not present

## 2020-12-20 DIAGNOSIS — T7801XA Anaphylactic reaction due to peanuts, initial encounter: Secondary | ICD-10-CM | POA: Diagnosis not present

## 2020-12-20 DIAGNOSIS — Z20822 Contact with and (suspected) exposure to covid-19: Secondary | ICD-10-CM | POA: Insufficient documentation

## 2020-12-20 DIAGNOSIS — R110 Nausea: Secondary | ICD-10-CM | POA: Diagnosis present

## 2020-12-20 DIAGNOSIS — B349 Viral infection, unspecified: Secondary | ICD-10-CM

## 2020-12-20 LAB — RESP PANEL BY RT-PCR (FLU A&B, COVID) ARPGX2
Influenza A by PCR: NEGATIVE
Influenza B by PCR: NEGATIVE
SARS Coronavirus 2 by RT PCR: NEGATIVE

## 2020-12-20 MED ORDER — ONDANSETRON 4 MG PO TBDP: 4.0000 mg | ORAL_TABLET | Freq: Three times a day (TID) | ORAL | 0 refills | Status: DC | PRN

## 2020-12-20 NOTE — Discharge Instructions (Addendum)
Your history and physical exam is suggestive of a viral illness.  You have been tested for COVID-19 and influenza.   Please maintain isolation precautions.  Check your temperature regularly and take Tylenol as needed for fever control.  Increase your oral hydration and continue to eat regular meals to avoid electrolyte derangement and further fatigue.  I recommend over-the-counter medications as needed for symptom relief.    Specifically, I recommend continued Pedialyte/Gatorade/Powerade.  Continue with Imodium as needed for loose stools.  It is important that you avoid dehydration.  I have prescribed Zofran ODT which you can take as needed for nausea symptoms.    Follow-up with your primary care provider regarding today's encounter and for ongoing management.    Return to the ED or seek immediate medical attention should you experience any new or worsening symptoms.

## 2020-12-20 NOTE — ED Provider Notes (Signed)
[facility] Provider Note   CSN: [id number] Arrival date & time: 12/20/20  1649     History Chief Complaint  Patient presents with  . Nausea  . Shaking    Amanda Espinoza is a 25 y.o. female with past medical history of anxiety and depression on Celexa presents the ED with complaints of chills and nausea subsequent to an allergic reaction.  She is reportedly followed by Dr. Chanetta Marshall at [facility].    On my examination, patient is bundled up under the blankets.  She states that she went to a wedding in [place], [place] yesterday and developed hives after eating the dinner.  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  She reports that she broke out in hives over her face and neck, but subsequently was given Benadryl with improvement.  She went home with her wife and felt improved, albeit mildly fatigued.  Last night she states that she was getting up every couple hours to go to the bathroom.  She endorses approximately 10 episodes of loose, nonbloody stools.  She states that her T-max at home was 9 F was prompted to come to the ED for evaluation.  She has been having chills with rigors.  She also endorses having nausea symptoms, but without any emesis.  She has nasal congestion and has to clear her throat regularly.  Denies any shortness of breath or productive cough.  Her hives never returned and she denies any throat closing sensation or difficulty swallowing.  She has not had an appetite today, but suspects that that is in the context of her cold and flu symptoms.  She also denies any chest pain, shortness of breath, severe sore throat, abdominal pain, vaginal bleeding or pelvic symptoms, hematochezia/melena, dysuria or increased urinary frequency, headache, or other symptoms.  She received her COVID-19 vaccinations last year and then had active COVID-19 infection around Christmas 2021.  She  denies possibility of pregnancy and does not take any other medications regularly aside from her Celexa and occasional cetirizine for seasonal allergies.  She has been drinking plenty of fluids including Pedialyte and taking Imodium for loose stools.  HPI     Past Medical History:  Diagnosis Date  . Anxiety    since quite young  . Depression    diagnosed as young child.  Lot of difficulties with parental health issues, loss of homes    Patient Active Problem List   Diagnosis Date Noted  . Morbid obesity with BMI of 40.0-44.9, adult (HCC) 09/22/2018  . Depression   . Anxiety     Past Surgical History:  Procedure Laterality Date  . APPENDECTOMY  1997  . KNEE ARTHROSCOPY Left 2016     OB History   No obstetric history on file.     Family History  Problem Relation Age of Onset  . Bipolar disorder Mother   . Post-traumatic stress disorder Mother   . Alcohol abuse Mother   . ADD / ADHD Father   . Personality disorder Father        Narcissisistic per patient  . Hypertension Father   . Alcohol abuse Father   . Bipolar disorder Sister        refuses medication    Social History   Tobacco Use  . Smoking status: Never Smoker  . Smokeless tobacco: Never Used  Substance Use Topics  . Alcohol use: Yes    Comment: rare.  was a problem in past.  Stopped when checked into mental health institution at age 25 yo.  . Drug use: No    Home Medications Prior to Admission medications   Medication Sig Start Date End Date Taking? Authorizing Provider  ondansetron (ZOFRAN ODT) 4 MG disintegrating tablet Take 1 tablet (4 mg total) by mouth every 8 (eight) hours as needed for nausea or vomiting. 12/20/20  Yes Lorelee NewGreen, Daine Croker L, PA-C  albuterol (VENTOLIN HFA) 108 (90 Base) MCG/ACT inhaler Inhale 2 puffs into the lungs every 6 (six) hours as needed for wheezing or shortness of breath. 07/03/19   Junie SpencerHawks, Christy A, FNP  citalopram (CELEXA) 40 MG tablet Take 1 tablet (40 mg total) by mouth  daily. 12/01/20 12/31/20  Estella HuskLaubach, Katherine S, MD    Allergies    Patient has no known allergies.  Review of Systems   Review of Systems  All other systems reviewed and are negative.   Physical Exam Updated Vital Signs BP (!) 151/97 (BP Location: Right Arm)   Pulse 99   Temp 99.9 F (37.7 C) (Oral)   Resp 19   SpO2 99%   Physical Exam Vitals and nursing note reviewed. Exam conducted with a chaperone present.  Constitutional:      General: She is not in acute distress.    Appearance: She is ill-appearing. She is not toxic-appearing.  HENT:     Head: Normocephalic and atraumatic.     Nose: Congestion present.     Mouth/Throat:     Pharynx: Oropharynx is clear. No oropharyngeal exudate or posterior oropharyngeal erythema.     Comments: Patent oropharynx. Eyes:     General: No scleral icterus.    Conjunctiva/sclera: Conjunctivae normal.  Cardiovascular:     Rate and Rhythm: Normal rate and regular rhythm.     Pulses: Normal pulses.  Pulmonary:     Effort: Pulmonary effort is normal.     Breath sounds: Wheezing present.     Comments: Mild expiratory wheezing bilaterally (chronic).  No stridor.  No increased work of breathing.  Oxygenating well on room air.  No tachypnea. Abdominal:     General: Abdomen is flat. There is no distension.     Palpations: Abdomen is soft.     Tenderness: There is no abdominal tenderness. There is no right CVA tenderness, left CVA tenderness or guarding.     Comments: Soft, nondistended.  History of appendectomy.  No right upper quadrant abdominal tenderness or TTP elsewhere.  Skin:    General: Skin is dry.     Findings: No rash.     Comments: No hives.  Neurological:     Mental Status: She is alert and oriented to person, place, and time.     GCS: GCS eye subscore is 4. GCS verbal subscore is 5. GCS motor subscore is 6.  Psychiatric:        Mood and Affect: Mood normal.        Behavior: Behavior normal.        Thought Content: Thought  content normal.     ED Results / Procedures / Treatments   Labs (all labs ordered are listed, but only abnormal results are displayed) Labs Reviewed  RESP PANEL BY RT-PCR (FLU A&B, COVID) ARPGX2    EKG None  Radiology No results found.  Procedures Procedures   Medications Ordered in ED Medications - No data to display  ED Course  I have reviewed the triage vital signs and the nursing notes.  Pertinent  labs & imaging results that were available during my care of the patient were reviewed by me and considered in my medical decision making (see chart for details).    MDM Rules/Calculators/A&P                          Lyndell Gillyard was evaluated in Emergency Department on 12/20/2020 for the symptoms described in the history of present illness. She was evaluated in the context of the global COVID-19 pandemic, which necessitated consideration that the patient might be at risk for infection with the SARS-CoV-2 virus that causes COVID-19. Institutional protocols and algorithms that pertain to the evaluation of patients at risk for COVID-19 are in a state of rapid change based on information released by regulatory bodies including the CDC and federal and state organizations. These policies and algorithms were followed during the patient's care in the ED.  I personally reviewed patient's medical chart and all notes from triage and staff during today's encounter. I have also ordered and reviewed all labs and imaging that I felt to be medically necessary in the evaluation of this patient's complaints and with consideration of their physical exam. If needed, translation services were available and utilized.   Patient with symptoms consistent with viral illness for 1 day.  Respiratory panel testing including COVID-19 and influenza is obtained.  Lower suspicion for infectious mononucleosis and no significant organomegaly appreciated on exam.  While she endorses frequent loose stools, her  abdominal exam is entirely benign and given the brevity of illness I have lower suspicion for electrolyte derangement.  She denies any abdominal pain.  While she has nausea, she can still tolerate food and liquids by mouth.  She simply has diminished appetite.  We will discharge her home with Zofran ODT given her nausea symptoms.  Given brevity of illness and reassuring physical exam, do not feel as though imaging of chest is warranted. Their collection of symptoms are likely of viral etiology and we discussed that antibiotics are not indicated for viral infections.  Patient will be discharged with symptomatic treatment.  Patient is tolerating food and liquid without difficulty and I do not believe that laboratory work-up would yield any significant findings.  Low suspicion for electrolyte derangement despite her loose stools, but emphasized the importance of eating regularly.  I also emphasized the importance of rest, continued oral hydration, and antipyretics as needed for fever control.    While she has mild wheezing auscultated on exam bilaterally, she states that she has albuterol inhaler at home and does not want treatment here in the ED.  She states that this is ongoing ever since her COVID-19 diagnosis and her primary care provider is well aware.  She denies any respiratory symptoms.  There is no stridor and she has patent oropharynx on my exam.  No hives or other rash.  Doubt anaphylaxis.  Doubt serotonin syndrome.    They were provided opportunity to ask any additional questions and have none at this time.  Prior to discharge patient is feeling well, agreeable with plan for discharge home.  They have expressed understanding of verbal discharge instructions as well as return precautions and are agreeable to the plan.    Final Clinical Impression(s) / ED Diagnoses Final diagnoses:  Viral illness    Rx / DC Orders ED Discharge Orders         Ordered    ondansetron (ZOFRAN ODT) 4 MG  disintegrating tablet  Every 8 hours  PRN        12/20/20 1818           Lorelee New, PA-C 12/20/20 1818    Derwood Kaplan, MD 12/20/20 [phone]

## 2020-12-20 NOTE — ED Triage Notes (Signed)
Patient here from home reporting allergic reaction after wedding yesterday from nuts in which there were hives. Reports today chills, shakes, and nausea.

## 2021-03-16 ENCOUNTER — Other Ambulatory Visit: Payer: Self-pay

## 2021-03-16 ENCOUNTER — Encounter: Payer: Self-pay | Admitting: Allergy and Immunology

## 2021-03-16 ENCOUNTER — Ambulatory Visit (INDEPENDENT_AMBULATORY_CARE_PROVIDER_SITE_OTHER): Payer: 59 | Admitting: Allergy and Immunology

## 2021-03-16 VITALS — BP 124/76 | HR 79 | Temp 98.4°F | Resp 16 | Ht 65.0 in | Wt 290.2 lb

## 2021-03-16 DIAGNOSIS — J3089 Other allergic rhinitis: Secondary | ICD-10-CM

## 2021-03-16 DIAGNOSIS — J301 Allergic rhinitis due to pollen: Secondary | ICD-10-CM

## 2021-03-16 DIAGNOSIS — T7800XA Anaphylactic reaction due to unspecified food, initial encounter: Secondary | ICD-10-CM

## 2021-03-16 DIAGNOSIS — J4530 Mild persistent asthma, uncomplicated: Secondary | ICD-10-CM

## 2021-03-16 DIAGNOSIS — H1010 Acute atopic conjunctivitis, unspecified eye: Secondary | ICD-10-CM

## 2021-03-16 DIAGNOSIS — K219 Gastro-esophageal reflux disease without esophagitis: Secondary | ICD-10-CM

## 2021-03-16 DIAGNOSIS — H1013 Acute atopic conjunctivitis, bilateral: Secondary | ICD-10-CM | POA: Diagnosis not present

## 2021-03-16 MED ORDER — OLOPATADINE HCL 0.2 % OP SOLN: 1.0000 [drp] | OPHTHALMIC | 5 refills | Status: AC

## 2021-03-16 MED ORDER — CETIRIZINE HCL 10 MG PO TABS: 10.0000 mg | ORAL_TABLET | Freq: Two times a day (BID) | ORAL | 5 refills | Status: DC

## 2021-03-16 MED ORDER — MONTELUKAST SODIUM 10 MG PO TABS: 10.0000 mg | ORAL_TABLET | Freq: Every day | ORAL | 5 refills | Status: DC

## 2021-03-16 MED ORDER — ALVESCO 160 MCG/ACT IN AERS: 1.0000 | INHALATION_SPRAY | Freq: Two times a day (BID) | RESPIRATORY_TRACT | 5 refills | Status: DC

## 2021-03-16 MED ORDER — TRIAMCINOLONE ACETONIDE 55 MCG/ACT NA AERO: 2.0000 | INHALATION_SPRAY | Freq: Every day | NASAL | 5 refills | Status: AC

## 2021-03-16 MED ORDER — OMEPRAZOLE 40 MG PO CPDR: 40.0000 mg | DELAYED_RELEASE_CAPSULE | Freq: Every day | ORAL | 5 refills | Status: DC

## 2021-03-16 MED ORDER — EPINEPHRINE 0.3 MG/0.3ML IJ SOAJ: 0.3000 mg | Freq: Once | INTRAMUSCULAR | 2 refills | Status: AC

## 2021-03-16 NOTE — Patient Instructions (Addendum)
1.  Allergen avoidance measures - Dust mite, cat, dog, pollen, mold.. Check nut panel w/R  2.  Treat and prevent inflammation:  A. Alvesco 160 - 1 inhalation 1 time per day w/spacer (empty lungs) B. OTC Nasacort - 2 sprays each nostril 1 time per day C. Montelukast 10 mg - 1 tablet 1 time per day D. Prednisone 10 mg - 1 tablet 1 time per day for 10 days only  3.  Treat and prevent reflux:  A. Omeprazole 40 mg - 1 tablet 1 time per day B. Minimize caffeine consumption  4.  If needed:  A. Albuterol HFA - 2 inhalations every 4-6 hours B. Auvi-Q 0.3, benadryl, MD/ER evaluation for allergic reaction C. Cetirizine 10 mg - 1 tablet 1-2 times per day D. Pataday - 1 drop each eye 1 time per day  5.  Return to clinic in 4 weeks or earlier if problem

## 2021-03-16 NOTE — Progress Notes (Signed)
[place] - [place] - [place] - [place] - [place]   Dear Dr. Chanetta Marshall,  Thank you for referring Amanda Espinoza to the [facility] [place] on 03/16/2021.   Below is a summation of this patient's evaluation and recommendations.  Thank you for your referral. I will keep you informed about this patient's response to treatment.   If you have any questions please do not hesitate to contact me.   Sincerely,  Jessica Priest, MD Allergy / Immunology [place] [facility]   ______________________________________________________________________    NEW PATIENT NOTE  Referring Provider: Shon Hale, * Primary Provider: Shon Hale, MD Date of office visit: 03/16/2021    Subjective:   Chief Complaint:  Amanda Espinoza (DOB: 02-14-1996) is a 25 y.o. female who presents to the clinic on 03/16/2021 with a chief complaint of Allergic Reaction (Nut allergy conformation. Last reaction about a month ago. At a wedding she consumed food where she believes nuts were in the food. Stated that after consuming the food she began to hive up all over her body. ) and Asthma (Asthma as a child but out grew it. After Covid began having issues. Covid Dec 28,2021) .     HPI: Amanda Espinoza presents to this clinic in evaluation of longstanding allergic disease that has assumed a progressive nature over the course of the past several years.  Since she was a little girl she has had problems with nasal congestion and sneezing and itchy red watery eyes.  This has progressed over the course of the past several years and she had a particularly bad spring in 2022.  She believes that exposures to cats and dogs and pollen precipitates this issue but she has any symptoms the entire year without any obvious provoking factors.  She has tried various antihistamines which have not really helped her to any significant degree.  When she  was in third grade she apparently received immunotherapy for a short interval of time.  She has also had wheezing and coughing since a little girl.  This was not a particularly big issue until this spring.  This has really become a big issue since she contracted COVID in December 2022 and she has had persistent wheezing and coughing ever since.  She has been given a short acting bronchodilator and indeed she does respond to this short acting bronchodilator short-term.  She needs to use his bronchodilator at night at least twice a week.  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  Her last major exposure was at a wedding while eating [place] food.  She immediately started to develop hives across her body and some sensation of tongue swelling and then the end entire next day she had really bad diarrhea.  That occurred about 6 weeks ago.  She can eat peanuts with no problem.  She also has a history with mid chest heartburn and regurgitation for which she will take Tums if she eats late or eats tomato-based food.  She does drink 1 coffee per day and really has no other sources of caffeine.  Past Medical History:  Diagnosis Date   Anxiety    since quite young   Depression    diagnosed as young child.  Lot of difficulties with parental health issues, loss of homes    Past Surgical History:  Procedure Laterality Date   APPENDECTOMY  1997   KNEE ARTHROSCOPY Left 2016    Allergies as of 03/16/2021       Reactions   Justicia Adhatoda (malabar Nut Tree) [justicia Adhatoda] Itching, Swelling   Other reaction(s): mouth itch and cough        Medication List      albuterol 108 (90 Base) MCG/ACT inhaler Commonly known as: VENTOLIN HFA Inhale 2 puffs into the lungs every 6 (six) hours as needed for wheezing or shortness of breath.   citalopram 40 MG tablet Commonly known as: CELEXA Take 1 tablet (40 mg total) by  mouth daily.   EPINEPHrine 0.3 mg/0.3 mL Soaj injection Commonly known as: EPI-PEN Inject as directed See admin instructions.   fexofenadine 180 MG tablet Commonly known as: ALLEGRA Take 180 mg by mouth daily.        Review of systems negative except as noted in HPI / PMHx or noted below:  Review of Systems  Constitutional: Negative.   HENT: Negative.    Eyes: Negative.   Respiratory: Negative.    Cardiovascular: Negative.   Gastrointestinal: Negative.   Genitourinary: Negative.   Musculoskeletal: Negative.   Skin: Negative.   Neurological: Negative.   Endo/Heme/Allergies: Negative.   Psychiatric/Behavioral: Negative.     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  Tobacco Use   Smoking status: Former    Pack years: 0.00    Types: Cigarettes   Smokeless tobacco: Never  Substance and Sexual Activity   Alcohol use: Yes    Comment: rare.  was a problem in past.  Stopped when checked into mental health institution at age 72 yo.   Drug use: No   Sexual activity: Yes    Birth control/protection: None  Other Topics Concern   Not on file  Social History Narrative   Lives with her partner for about 1 year.   Environmental and Social history  Lives in a house with a dry environment, cats and dogs look inside the household, no carpet in the bedroom, no  plastic on the bed, no plastic on the pillow, no smoking ongoing with inside the household.  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  Objective:   Vitals:   03/16/21 1431  BP: 124/76  Pulse: 79  Resp: 16  Temp: 98.4 F (36.9 C)  SpO2: 97%   Height: 5\' 5"  (165.1 cm) Weight: 290 lb 3.2 oz (131.6 kg)  Physical Exam Constitutional:      Appearance: She is not diaphoretic.  HENT:     Head: Normocephalic. No right periorbital erythema or left periorbital erythema.     Right Ear: Tympanic membrane, ear canal and external ear normal.     Left Ear: Tympanic  membrane, ear canal and external ear normal.     Nose: Mucosal edema present. No rhinorrhea.     Mouth/Throat:     Pharynx: No oropharyngeal exudate.  Eyes:     General: Lids are normal.     Conjunctiva/sclera: Conjunctivae normal.     Pupils: Pupils are equal, round, and reactive to light.  Neck:     Thyroid: No thyromegaly.     Trachea: Trachea normal. No tracheal deviation.  Cardiovascular:     Rate and Rhythm: Normal rate and regular rhythm.     Heart sounds: Normal heart sounds, S1 normal and S2 normal. No murmur heard. Pulmonary:     Effort: Pulmonary effort is normal. No respiratory distress.     Breath sounds: No stridor. Wheezing (Bilateral expiratory wheezes left greater than right posterior lung fields) present. No rales.  Chest:     Chest wall: No tenderness.  Abdominal:     General: There is no distension.     Palpations: Abdomen is soft. There is no mass.     Tenderness: There is no abdominal tenderness. There is no guarding or rebound.  Musculoskeletal:        General: No tenderness.  Lymphadenopathy:     Head:     Right side of head: No tonsillar adenopathy.     Left side of head: No tonsillar adenopathy.     Cervical: No cervical adenopathy.  Skin:    Coloration: Skin is not pale.     Findings: No erythema or rash.     Nails: There is no clubbing.  Neurological:      Mental Status: She is alert.    Diagnostics: Allergy skin tests were performed.  She demonstrated hypersensitivity against house dust mite, cat, dog, pollens, molds.  Spirometry was performed and demonstrated an FEV1 of 4.23 @ 127 % of predicted. FEV1/FVC = 0.82.  Following the administration of nebulized albuterol her FEV1 increased to 4.58 which was a calculated increase of 8%.  Assessment and Plan:    1. Not well controlled mild persistent asthma   2. Perennial allergic rhinitis   3. Seasonal allergic rhinitis due to pollen   4. 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 Gastroesophageal reflux disease, unspecified whether esophagitis present     1.  Allergen avoidance measures - Dust mite, cat, dog, pollen, mold. Check nut panel w/R  2.  Treat and prevent inflammation:  A. Alvesco 160 - 1 inhalation 1 time per day w/spacer (empty lungs) B. OTC Nasacort - 2 sprays each nostril 1 time per day C. Montelukast 10 mg - 1 tablet 1 time per day D. Prednisone 10 mg - 1 tablet 1 time per day for 10 days only  3.  Treat and prevent reflux:  A. Omeprazole 40 mg - 1 tablet 1 time per day B. Minimize caffeine consumption  4.  If needed:  A. Albuterol HFA - 2 inhalations every 4-6 hours B. Auvi-Q 0.3, benadryl, MD/ER evaluation for allergic reaction C. Cetirizine 10 mg - 1 tablet 1-2 times per day D. Pataday - 1 drop each eye 1 time per day  5.  Return to clinic in 4 weeks or earlier if problem  Wyn ForsterMadison has multiorgan atopic disease and we will get her to perform allergen avoidance measures as best as possible and use a collection of anti-inflammatory agents for her airway.  We will work through her history of nut allergy by checking IgE antibodies  directed against various nut components in anticipation of possibly providing her an in clinic food challenge against these foods.  She also has reflux and she will start omeprazole.  She would definitely  be a candidate for immunotherapy should she fail medical treatment.  I will regroup with her in 4 weeks to assess her response to this approach.   Jessica Priest, MD Allergy / Immunology [facility] [place]

## 2021-03-17 ENCOUNTER — Telehealth: Payer: Self-pay

## 2021-03-17 ENCOUNTER — Encounter: Payer: Self-pay | Admitting: Allergy and Immunology

## 2021-03-17 MED ORDER — ALVESCO 160 MCG/ACT IN AERS: 1.0000 | INHALATION_SPRAY | Freq: Two times a day (BID) | RESPIRATORY_TRACT | 5 refills | Status: AC

## 2021-03-17 NOTE — Telephone Encounter (Signed)
Change alvesco to speicality [facility] and sent it in

## 2021-03-21 LAB — IGE NUT PROF. W/COMPONENT RFLX
F017-IgE Hazelnut (Filbert): <0.1 kU/L
F018-IgE Brazil Nut: <0.1 kU/L
F020-IgE Almond: <0.1 kU/L
F202-IgE Cashew Nut: <0.1 kU/L
F203-IgE Pistachio Nut: <0.1 kU/L
F256-IgE Walnut: <0.1 kU/L
Macadamia Nut, IgE: <0.1 kU/L
Peanut, IgE: <0.1 kU/L
Pecan Nut IgE: <0.1 kU/L

## 2021-04-02 NOTE — Progress Notes (Signed)
Called and left a voicemail asking for patient to return call to discuss.  °

## 2021-04-11 NOTE — Patient Instructions (Addendum)
Mild persistent asthma Continue Alvesco 160 mcg 1 inhalation once a day with spacer to help prevent cough and wheeze Continue montelukast 10 mg once a day to help prevent cough and wheeze May use albuterol 2 puffs every 4-6 hours as needed for cough, wheeze, tightness in chest or shortness of breath  Seasonal and perennial allergic rhinitis Continue avoidance measures directed towards dust mite, cat, dog, pollen and mold) Continue Nasacort 2 sprays each nostril once a day as needed for stuffy nose Continue montelukast 10 mg as above Continue Pataday 1 drop each eye once a day as needed for itchy watery eyes Continue Zyrtec 10 mg once a day as needed for runny nose  Allergy with anaphylaxis due to food. Avoid tree nuts. In case of an allergic reaction, give Benadryl 4 teaspoonfuls every 4 hours, and if life-threatening symptoms occur, inject with EpiPen 0.3 mg. If your symptoms re-occur, begin a journal of events that occurred for up to 6 hours before your symptoms began including foods and beverages consumed, soaps or perfumes you had contact with, and medications.   Reflux Continue dietary and lifestyle modifications Continue omeprazole 40 mg once a day  Please let us know if this treatment plan is not working well for you. Schedule a follow up appointment in 4 months or sooner if needed

## 2021-04-12 ENCOUNTER — Ambulatory Visit (INDEPENDENT_AMBULATORY_CARE_PROVIDER_SITE_OTHER): Payer: 59 | Admitting: Family

## 2021-04-12 ENCOUNTER — Encounter: Payer: Self-pay | Admitting: Family

## 2021-04-12 ENCOUNTER — Other Ambulatory Visit: Payer: Self-pay

## 2021-04-12 ENCOUNTER — Telehealth: Payer: Self-pay

## 2021-04-12 VITALS — BP 118/74 | HR 71 | Temp 97.9°F | Resp 18 | Ht 65.0 in | Wt 287.2 lb

## 2021-04-12 DIAGNOSIS — J4530 Mild persistent asthma, uncomplicated: Secondary | ICD-10-CM | POA: Diagnosis not present

## 2021-04-12 DIAGNOSIS — H1010 Acute atopic conjunctivitis, unspecified eye: Secondary | ICD-10-CM

## 2021-04-12 DIAGNOSIS — K219 Gastro-esophageal reflux disease without esophagitis: Secondary | ICD-10-CM

## 2021-04-12 DIAGNOSIS — J301 Allergic rhinitis due to pollen: Secondary | ICD-10-CM | POA: Diagnosis not present

## 2021-04-12 DIAGNOSIS — T7800XA Anaphylactic reaction due to unspecified food, initial encounter: Secondary | ICD-10-CM

## 2021-04-12 DIAGNOSIS — J3089 Other allergic rhinitis: Secondary | ICD-10-CM

## 2021-04-12 DIAGNOSIS — H1013 Acute atopic conjunctivitis, bilateral: Secondary | ICD-10-CM

## 2021-04-12 NOTE — Progress Notes (Signed)
[address] Amanda Espinoza [place] [address] Dept: [phone]  FOLLOW UP NOTE  Patient ID: Amanda Espinoza, female    DOB: 11-01-1995  Age: 25 y.o. MRN: [id number] Date of Office Visit: 04/12/2021  Assessment  Chief Complaint: Asthma (No flares or symptoms inhalers have been helping. )  HPI Margan Elias is a 25 year old female who presents today for follow-up of not well controlled mild persistent asthma, perennial allergic rhinitis, seasonal allergic rhinitis due to pollen, seasonal allergic conjunctivitis, allergy with anaphylaxis due to food, and gastroesophageal reflux disease.  She was last seen on March 16, 2021 by Dr. Lucie Leather.  Mild persistent asthma is reported as controlled with Alvesco 160 mg 1 puff once a day, montelukast 10 mg once a day, and albuterol as needed.  She denies any coughing, wheezing, tightness in chest, shortness of breath, nocturnal awakenings due to breathing problems.  Since her last office visit she has not required any systemic steroids or made any trips to the emergency room or urgent care due to breathing problems.  She has not had to use her albuterol inhaler since her last office visit.  Seasonal and perennial allergic rhinitis is reported as controlled with cetirizine 10 mg once a day as needed and Nasacort 2 sprays each nostril once a day.  She denies any rhinorrhea, nasal congestion, and postnasal drip.  She has not had any sinus infections since we last saw her.  She she reports that she is now able to breathe out her nose.  Allergic conjunctivitis is reported as controlled.  She denies any itchy watery eyes.  She continues to avoid tree nuts without any accidental ingestion or use of her EpiPen.  She reports that she had another incident prior to the wedding where she was eating [place] food where she had a reaction to tree nuts.  She reports that back in 2015 she was eating trail mix and her tongue began to itch and swell.  Discussed the option of doing  a mixed tree nut butter challenge since her lab work and skin testing were negative.  She reports that she would like to continue to avoid tree nuts for now.  She reports that she is able to eat peanuts without any problems.  She also reports that when she drinks draft beer that it will cause hives.  If she drinks beer out of a bottle she does not have hives.  Gastroesophageal reflux disease is reported as controlled with omeprazole 40 mg once a day.  She denies any reflux or heartburn symptoms.   Drug Allergies:  Allergies  Allergen Reactions   Justicia Adhatoda ([place] Nut Tree) [Justicia Adhatoda] Itching and Swelling    Other reaction(s): mouth itch and cough    Review of Systems: Review of Systems  Constitutional:  Negative for chills and fever.  HENT:         Denies rhinorrhea, nasal congestion and post nasal drip  Eyes:        Denies itchy watery eyes  Respiratory:  Negative for cough, shortness of breath and wheezing.   Cardiovascular:  Negative for chest pain and palpitations.  Gastrointestinal:        Denies heartburn and reflux symptoms  Genitourinary:  Negative for dysuria.  Skin:  Negative for itching and rash.       Reports hives when drinking most draft beers  Neurological:  Negative for headaches.  Endo/Heme/Allergies:  Positive for environmental allergies.    Physical Exam: BP 118/74   Pulse 71  Temp 97.9 F (36.6 C)   Resp 18   Ht 5\' 5"  (1.651 m)   Wt 287 lb 3.2 oz (130.3 kg)   SpO2 98%   BMI 47.79 kg/m    Physical Exam Constitutional:      Appearance: Normal appearance.  HENT:     Head: Normocephalic and atraumatic.     Comments: Pharynx normal, eyes normal, ears normal, nose: Bilateral lower turbinates mildly edematous and slightly erythematous with clear drainage noted    Right Ear: Tympanic membrane, ear canal and external ear normal.     Left Ear: Tympanic membrane, ear canal and external ear normal.     Mouth/Throat:     Mouth: Mucous  membranes are moist.     Pharynx: Oropharynx is clear.  Eyes:     Conjunctiva/sclera: Conjunctivae normal.  Cardiovascular:     Rate and Rhythm: Normal rate and regular rhythm.     Heart sounds: Normal heart sounds.  Pulmonary:     Effort: Pulmonary effort is normal.     Breath sounds: Normal breath sounds.     Comments: Lungs clear to auscultation Musculoskeletal:     Cervical back: Neck supple.  Skin:    General: Skin is warm.  Neurological:     Mental Status: She is alert and oriented to person, place, and time.  Psychiatric:        Mood and Affect: Mood normal.        Behavior: Behavior normal.        Thought Content: Thought content normal.        Judgment: Judgment normal.    Diagnostics: FVC 5.32 L, FEV1 4.69 L.  Predicted FVC 4.05 L, predicted FEV1 3.45 L.  Spirometry indicates normal ventilatory function.  Assessment and Plan: 1. Mild persistent asthma without complication   2. Perennial allergic rhinitis   3. Seasonal allergic rhinitis due to pollen   4. Allergy with anaphylaxis due to food   5. Gastroesophageal reflux disease, unspecified whether esophagitis present   6. Seasonal allergic conjunctivitis     No orders of the defined types were placed in this encounter.   Patient Instructions  Mild persistent asthma Continue Alvesco 160 mcg 1 inhalation once a day with spacer to help prevent cough and wheeze Continue montelukast 10 mg once a day to help prevent cough and wheeze May use albuterol 2 puffs every 4-6 hours as needed for cough, wheeze, tightness in chest or shortness of breath  Seasonal and perennial allergic rhinitis Continue avoidance measures directed towards dust mite, cat, dog, pollen and mold) Continue Nasacort 2 sprays each nostril once a day as needed for stuffy nose Continue montelukast 10 mg as above Continue Pataday 1 drop each eye once a day as needed for itchy watery eyes Continue Zyrtec 10 mg once a day as needed for runny  nose  Allergy with anaphylaxis due to food. Avoid tree nuts. In case of an allergic reaction, give Benadryl 4 teaspoonfuls every 4 hours, and if life-threatening symptoms occur, inject with EpiPen 0.3 mg. If your symptoms re-occur, begin a journal of events that occurred for up to 6 hours before your symptoms began including foods and beverages consumed, soaps or perfumes you had contact with, and medications.   Reflux Continue dietary and lifestyle modifications Continue omeprazole 40 mg once a day  Please let June know if this treatment plan is not working well for you. Schedule a follow up appointment in 4 months or sooner if needed Return in  about 4 months (around 08/13/2021), or if symptoms worsen or fail to improve.    Thank you for the opportunity to care for this patient.  Please do not hesitate to contact me with questions.  Nehemiah Settle, FNP [facility]

## 2021-04-12 NOTE — Telephone Encounter (Signed)
Patient verbalized understanding of lab results and had no questions or concerns.

## 2021-04-13 NOTE — Addendum Note (Signed)
Addended by: Berna Bue on: 04/13/2021 09:31 AM   Modules accepted: Orders

## 2021-08-15 NOTE — Patient Instructions (Incomplete)
Mild persistent asthma Continue Alvesco 160 mcg 1 inhalation once a day with spacer to help prevent cough and wheeze Continue montelukast 10 mg once a day to help prevent cough and wheeze May use albuterol 2 puffs every 4-6 hours as needed for cough, wheeze, tightness in chest or shortness of breath  Seasonal and perennial allergic rhinitis Continue avoidance measures directed towards dust mite, cat, dog, pollen and mold) Continue Nasacort 2 sprays each nostril once a day as needed for stuffy nose Continue montelukast 10 mg as above Continue Pataday 1 drop each eye once a day as needed for itchy watery eyes Continue Zyrtec 10 mg once a day as needed for runny nose  Allergy with anaphylaxis due to food. Avoid tree nuts. In case of an allergic reaction, give Benadryl 4 teaspoonfuls every 4 hours, and if life-threatening symptoms occur, inject with EpiPen 0.3 mg. If your symptoms re-occur, begin a journal of events that occurred for up to 6 hours before your symptoms began including foods and beverages consumed, soaps or perfumes you had contact with, and medications.   Reflux Continue dietary and lifestyle modifications Continue omeprazole 40 mg once a day  Please let us know if this treatment plan is not working well for you. Schedule a follow up appointment in months or sooner if needed

## 2021-08-16 ENCOUNTER — Ambulatory Visit: Payer: 59 | Admitting: Family

## 2021-08-16 DIAGNOSIS — J309 Allergic rhinitis, unspecified: Secondary | ICD-10-CM

## 2021-09-25 ENCOUNTER — Other Ambulatory Visit: Payer: Self-pay | Admitting: Allergy and Immunology

## 2021-10-30 ENCOUNTER — Other Ambulatory Visit: Payer: Self-pay | Admitting: Allergy and Immunology
